# Patient Record
Sex: Male | Born: 1950 | Race: Black or African American | Hispanic: No | Marital: Married | Smoking: Never smoker
Health system: Southern US, Community
[De-identification: ages and names within clinical notes are randomized; demographics above are authoritative.]

## PROBLEM LIST (undated history)

## (undated) DIAGNOSIS — I10 Essential (primary) hypertension: Secondary | ICD-10-CM

## (undated) HISTORY — PX: KNEE SURGERY: SHX244

---

## 1974-03-15 DIAGNOSIS — G43909 Migraine, unspecified, not intractable, without status migrainosus: Secondary | ICD-10-CM | POA: Insufficient documentation

## 1993-03-15 DIAGNOSIS — J309 Allergic rhinitis, unspecified: Secondary | ICD-10-CM | POA: Insufficient documentation

## 2009-11-05 DIAGNOSIS — G4733 Obstructive sleep apnea (adult) (pediatric): Secondary | ICD-10-CM | POA: Insufficient documentation

## 2009-12-10 DIAGNOSIS — D126 Benign neoplasm of colon, unspecified: Secondary | ICD-10-CM | POA: Insufficient documentation

## 2012-04-24 DIAGNOSIS — R7303 Prediabetes: Secondary | ICD-10-CM | POA: Insufficient documentation

## 2016-12-16 ENCOUNTER — Emergency Department: Payer: BC Managed Care – PPO

## 2016-12-16 ENCOUNTER — Encounter: Payer: Self-pay | Admitting: Emergency Medicine

## 2016-12-16 ENCOUNTER — Emergency Department
Admission: EM | Admit: 2016-12-16 | Discharge: 2016-12-16 | Disposition: A | Discharge status: Home / Self Care | Payer: BC Managed Care – PPO | Attending: Emergency Medicine | Admitting: Emergency Medicine

## 2016-12-16 DIAGNOSIS — Y929 Unspecified place or not applicable: Secondary | ICD-10-CM | POA: Insufficient documentation

## 2016-12-16 DIAGNOSIS — Y998 Other external cause status: Secondary | ICD-10-CM | POA: Diagnosis not present

## 2016-12-16 DIAGNOSIS — S61213A Laceration without foreign body of left middle finger without damage to nail, initial encounter: Secondary | ICD-10-CM | POA: Diagnosis present

## 2016-12-16 DIAGNOSIS — S62663A Nondisplaced fracture of distal phalanx of left middle finger, initial encounter for closed fracture: Secondary | ICD-10-CM | POA: Insufficient documentation

## 2016-12-16 DIAGNOSIS — W312XXA Contact with powered woodworking and forming machines, initial encounter: Secondary | ICD-10-CM | POA: Diagnosis not present

## 2016-12-16 DIAGNOSIS — I10 Essential (primary) hypertension: Secondary | ICD-10-CM | POA: Diagnosis not present

## 2016-12-16 DIAGNOSIS — Z23 Encounter for immunization: Secondary | ICD-10-CM | POA: Diagnosis not present

## 2016-12-16 DIAGNOSIS — Y9389 Activity, other specified: Secondary | ICD-10-CM | POA: Diagnosis not present

## 2016-12-16 HISTORY — DX: Essential (primary) hypertension: I10

## 2016-12-16 MED ORDER — CEPHALEXIN 500 MG PO CAPS: 500.0000 mg | ORAL_CAPSULE | Freq: Four times a day (QID) | ORAL | 0 refills | Status: AC

## 2016-12-16 MED ORDER — TETANUS-DIPHTH-ACELL PERTUSSIS 5-2.5-18.5 LF-MCG/0.5 IM SUSP
0.5000 mL | Freq: Once | INTRAMUSCULAR | Status: AC
  Administered 2016-12-16: 0.5 mL via INTRAMUSCULAR
  Filled 2016-12-16: qty 0.5

## 2016-12-16 MED ORDER — LIDOCAINE HCL (PF) 1 % IJ SOLN
5.0000 mL | Freq: Once | INTRAMUSCULAR | Status: DC
  Filled 2016-12-16: qty 5

## 2016-12-16 NOTE — ED Triage Notes (Signed)
Patient presents to ED via POV due to a finger injury. Patient was using a saw when he accidentally cut his left middle finger. Slight bleeding noted.

## 2016-12-16 NOTE — ED Notes (Signed)
Pt was ambulatory to POV. NAD. VSS. No concerns or needs voiced at this time.

## 2016-12-16 NOTE — ED Provider Notes (Signed)
[facility] Emergency Department Provider Note   ____________________________________________   I have reviewed the triage vital signs and the nursing notes.   HISTORY  Chief Complaint Finger Injury    HPI Justin Gilbert is a 66 y.o. male presents to the emergency department with a laceration along the distal aspect of his left middle finger. Patient reports sawing with a power saw when he sustained a laceration to the pad of the DIP of the middle finger. Patient reports intact movement and sensation, as well as, managing hemorrhage control immediately following the injury. Patient is unsure if he "cut his finger to the bone or if debris entered the cut". Patient is unsure of his tetanus status. Patient denies fever, chills, headache, vision changes, chest pain, chest tightness, shortness of breath, abdominal pain, nausea and vomiting.  Past Medical History:  Diagnosis Date  . Hypertension     There are no active problems to display for this patient.   Past Surgical History:  Procedure Laterality Date  . KNEE SURGERY      Prior to Admission medications   Medication Sig Start Date End Date Taking? Authorizing Provider  cephALEXin (KEFLEX) 500 MG capsule Take 1 capsule (500 mg total) by mouth 4 (four) times daily. 12/16/16 12/21/16  Saphronia Ozdemir M, PA-C    Allergies Patient has no known allergies.  No family history on file.  Social History Social History  Substance Use Topics  . Smoking status: Never Smoker  . Smokeless tobacco: Never Used  . Alcohol use Yes     Comment: "on the weekends"    Review of Systems Constitutional: Negative for fever/chills Eyes: No visual changes. ENT:  Negative for sore throat and for difficulty swallowing Cardiovascular: Denies chest pain. Respiratory: Denies cough. Denies shortness of breath. Skin: Negative for rash. Laceration along the pad of the left middle digit. Neurological: Negative for headaches.   Negative focal weakness or numbness. Negative for loss of consciousness. Able to ambulate. ____________________________________________   PHYSICAL EXAM:  VITAL SIGNS: ED Triage Vitals [12/16/16 1330]  Enc Vitals Group     BP (!) 162/83     Pulse Rate (!) 102     Resp 17     Temp 98.5 F (36.9 C)     Temp Source Oral     SpO2 92 %     Weight 280 lb (127 kg)     Height 6\' 3"  (1.905 m)     Head Circumference      Peak Flow      Pain Score 2     Pain Loc      Pain Edu?      Excl. in [place]?     Constitutional: Alert and oriented. Well appearing and in no acute distress.  Head: Normocephalic and atraumatic. Cardiovascular: Normal rate, regular rhythm. Good peripheral circulation. Respiratory: Normal respiratory effort without tachypnea or retractions. Lungs CTAB.  Cardiovascular: Normal rate, regular rhythm. Normal distal pulses. Musculoskeletal: Nontender with normal range of motion in all extremities. Neurologic: Normal speech and language. Skin:  Skin is warm, dry and intact. No rash noted. Laceration lung the pad of the left middle digit approximately 3.0 cm without tendonous involvement. Very irrregular borders due to mechanism of injury being a power saw. Psychiatric: Mood and affect are normal. Speech and behavior are normal. Patient exhibits appropriate insight and judgement.  ____________________________________________   LABS (all labs ordered are listed, but only abnormal results are displayed)  Labs Reviewed - No data  to display ____________________________________________  EKG none ____________________________________________  RADIOLOGY DG left middle digit FINDINGS: Linear lucency within the proximal to midshaft of the third distal phalanx is suspicious for a nondisplaced fracture. No subluxation. Oval opacity projecting over the volar soft tissues of the distal digit, could represent small foreign body.  IMPRESSION: 1. Soft tissue laceration of the  distal third digit. 2. Nondisplaced fracture involving the proximal to midshaft of the third distal phalanx 3. Possible small oval foreign body projecting over the volar soft tissues in the region of the laceration  Imaging results reviewed, notable for non-displace fracture of the distal phalanx. Also notable for foreign body in the soft tissue of the distal phalanx consistent with debris found during irrigation. ____________________________________________   PROCEDURES  Procedure(s) performed:  LACERATION REPAIR Performed by: Jerolyn Shin Authorized by: Jerolyn Shin Consent: Verbal consent obtained. Risks and benefits: risks, benefits and alternatives were discussed Consent given by: patient Patient identity confirmed: provided demographic data Prepped and Draped in normal sterile fashion Wound explored  Laceration Location: pad of the left middle digit   Laceration Length: 2.5 cm  No Foreign Bodies seen or palpated  Anesthesia: transcortical block; local infiltration  Local anesthetic: lidocaine 1%  Anesthetic total: 5.0 ml  Irrigation method: syringe Amount of cleaning: standard  Skin closure: Monocryl 5-0; Ethilon 5-0  Number of sutures: (3) sutures, (12) sutures  Technique: simple interrupted  Patient tolerance: Patient tolerated the procedure well with no immediate complications.  Critical Care performed: no ____________________________________________   INITIAL IMPRESSION / ASSESSMENT AND PLAN / ED COURSE  Pertinent labs & imaging results that were available during my care of the patient were reviewed by me and considered in my medical decision making (see chart for details).  Patient sustained a laceration along the pad of the left middle digit.  Assessment confirmed movement and sensation of the digit before and after wound closure. Physical examination imaging notable for nondisplaced fracture of the distal phalanx of the left middle digit in  addition to possible foreign body in the soft tissue. Irrigation produced debris consistent with foreign body noted on imaging. Soft tissue was well irrigated prior to suturing. Laceration required suture closure as noted above. Patient tolerated procedure well. Due to the nature of debris and mechanism of injury patient will be prescribed cephalexin for about a coverage. She also received tetanus vaccine during course of care in emergency department. Pt instructed to keep wound clean and dry and will return to the emergency department or PCP for suture removal in 7 days. Patient also instructed to watch for signs of infection and return if changes are noted.  Patient informed of clinical course, understand medical decision-making process, and agree with plan.  ____________________________________________   FINAL CLINICAL IMPRESSION(S) / ED DIAGNOSES  Final diagnoses:  Laceration of left middle finger without foreign body, nail damage status unspecified, initial encounter  Closed nondisplaced fracture of distal phalanx of left middle finger, initial encounter       NEW MEDICATIONS STARTED DURING THIS VISIT:  New Prescriptions   CEPHALEXIN (KEFLEX) 500 MG CAPSULE    Take 1 capsule (500 mg total) by mouth 4 (four) times daily.     Note:  This document was prepared using Dragon voice recognition software and may include unintentional dictation errors.    Jerolyn Shin, PA-C 12/16/16 1649    Darel Hong, MD 12/17/16 [phone]

## 2016-12-16 NOTE — Discharge Instructions (Signed)
Keep wound area clean and dry and covered and environments that are dirty, dusty or wet.  Monitor the wound area for signs of developing infection. If you note any symptoms do not hesitate to follow up with your primary care or return to the emergency department.  Return to your primary care or emergency department in 7 days for suture removal.

## 2016-12-16 NOTE — ED Notes (Signed)
See triage note  Presents with laceration to left middle finger   Laceration from a saw  Bleeding controlled at present

## 2016-12-30 ENCOUNTER — Emergency Department
Admission: EM | Admit: 2016-12-30 | Discharge: 2016-12-30 | Disposition: A | Discharge status: Home / Self Care | Payer: Non-veteran care | Attending: Emergency Medicine | Admitting: Emergency Medicine

## 2016-12-30 ENCOUNTER — Encounter: Payer: Self-pay | Admitting: Emergency Medicine

## 2016-12-30 DIAGNOSIS — Z4802 Encounter for removal of sutures: Secondary | ICD-10-CM

## 2016-12-30 DIAGNOSIS — I10 Essential (primary) hypertension: Secondary | ICD-10-CM | POA: Insufficient documentation

## 2016-12-30 NOTE — ED Notes (Signed)
Pt has sutures in right index finger that he has in for three weeks. No s/s of infection

## 2016-12-30 NOTE — ED Notes (Signed)
Pt ambulatory without difficulty. VSS. NAD. Left middle finger was dressed with petroleum gauze and wrap secured with tape. Skin Pink, warm and dry.

## 2016-12-30 NOTE — ED Provider Notes (Signed)
[facility] Emergency Department Provider Note ____________________________________________  Time seen: 0858  I have reviewed the triage vital signs and the nursing notes.  HISTORY  Chief Complaint  Suture / Staple Removal  HPI Justin Gilbert is a 66 y.o. male presents to the ED for wound evaluation and suture removal. Patient was in the ED about 3 weeks prior, for a serious left middle finger DIP laceration. He also had an underlying tuft fracture with subcutaneous foreign body. The wound was appropriately flushed and the foreign body was resolved. His wound was closed with subcutaneous and dermal sutures. He presents now for wound check and suture removal. Denies any interim complaints.   Past Medical History:  Diagnosis Date  . Hypertension     There are no active problems to display for this patient.   Past Surgical History:  Procedure Laterality Date  . KNEE SURGERY      Prior to Admission medications   Not on File    Allergies Patient has no known allergies.  History reviewed. No pertinent family history.  Social History Social History  Substance Use Topics  . Smoking status: Never Smoker  . Smokeless tobacco: Never Used  . Alcohol use Yes     Comment: "on the weekends"    Review of Systems  Constitutional: Negative for fever. Cardiovascular: Negative for chest pain. Respiratory: Negative for shortness of breath. Musculoskeletal: Negative for back pain. Skin: Negative for rash. Left middle finger laceration s/p suture repair Neurological: Negative for headaches, focal weakness or numbness. ____________________________________________  PHYSICAL EXAM:  VITAL SIGNS: ED Triage Vitals  Enc Vitals Group     BP 12/30/16 0829 (!) 172/86     Pulse Rate 12/30/16 0829 84     Resp 12/30/16 0829 18     Temp 12/30/16 0827 97.7 F (36.5 C)     Temp Source 12/30/16 0827 Oral     SpO2 12/30/16 0829 96 %     Weight 12/30/16 0827 280 lb (127  kg)     Height 12/30/16 0827 6\' 3"  (1.905 m)     Head Circumference --      Peak Flow --      Pain Score 12/30/16 0945 0     Pain Loc --      Pain Edu? --      Excl. in [place]? --     Constitutional: Alert and oriented. Well appearing and in no distress. Head: Normocephalic and atraumatic.  Cardiovascular: Normal distal pulses. Respiratory: Normal respiratory effort.  Musculoskeletal: Nontender with normal range of motion in all extremities.  Neurologic:  Normal gait without ataxia. Normal speech and language. No gross focal neurologic deficits are appreciated. Skin:  Skin is warm, dry and intact. No rash noted. Left middle finger with nearly circumferential laceration to the distal phalanx. Several black nylon sutures are noted. Overlying wound adhesive is noted. The wound bed with minimal eschar and granulation tissue noted. No active bleeding. No erythema, edema, lymphangitis, or purulence noted.  ____________________________________________  PROCEDURES  SUTURE REMOVAL Performed by: Milinda Antis, RN  Consent: Verbal consent obtained. Patient identity confirmed: provided demographic data Time out: Immediately prior to procedure a "time out" was called to verify the correct patient, procedure, equipment, support staff and site/side marked as required.  Location details: left middle finger  Wound Appearance: clean  Sutures/Staples Removed: 12 nylon sutures   Facility: sutures placed in this facility Patient tolerance: Patient tolerated the procedure well with no immediate  complications. ____________________________________________  INITIAL IMPRESSION / ASSESSMENT AND PLAN / ED COURSE  Patient the ED evaluation for wound check and suture removal. The wound is healing well. He is dressed with petroleum-soaked gauze and dry dressing. Wound care instructions are provided. Follow-up with the [facility] or return to the ED as needed.   ____________________________________________  FINAL CLINICAL IMPRESSION(S) / ED DIAGNOSES  Final diagnoses:  Visit for suture removal      Carmie End, Dannielle Karvonen, PA-C 12/30/16 1118    Lisa Roca, MD 12/30/16 1244

## 2016-12-30 NOTE — ED Triage Notes (Signed)
Pt here to have stitches out of 3rd digit left hand. Placed 3 weeks ago. Pt reports had appt at [facility] for routine visit and was going to have taken out and then they cancelled appt so would like to get them out. Has had wrapped for 3 weeks.

## 2016-12-30 NOTE — Discharge Instructions (Signed)
Keep the wound clean, dry, and covered. Use only soap & water to cleanse. Follow-up with the [facility] as needed.

## 2019-01-12 IMAGING — DX DG FINGER MIDDLE 2+V*L*
3 series · 3 of 3 positions shown · non-contrast
Comparison: None.

CLINICAL DATA: Laceration

EXAM:
LEFT MIDDLE FINGER 2+V

[finger ap]
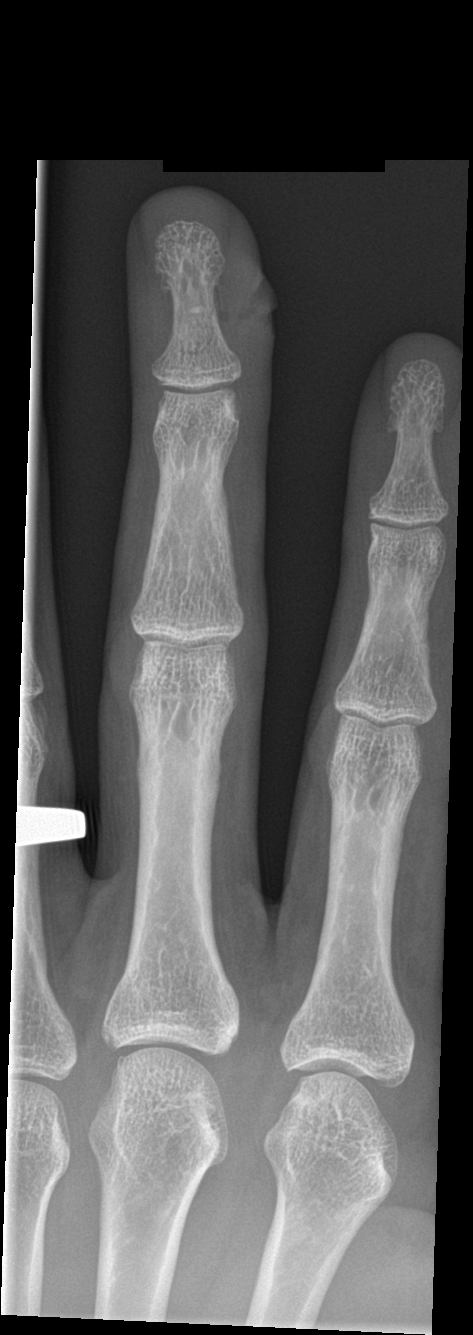

[finger obl]
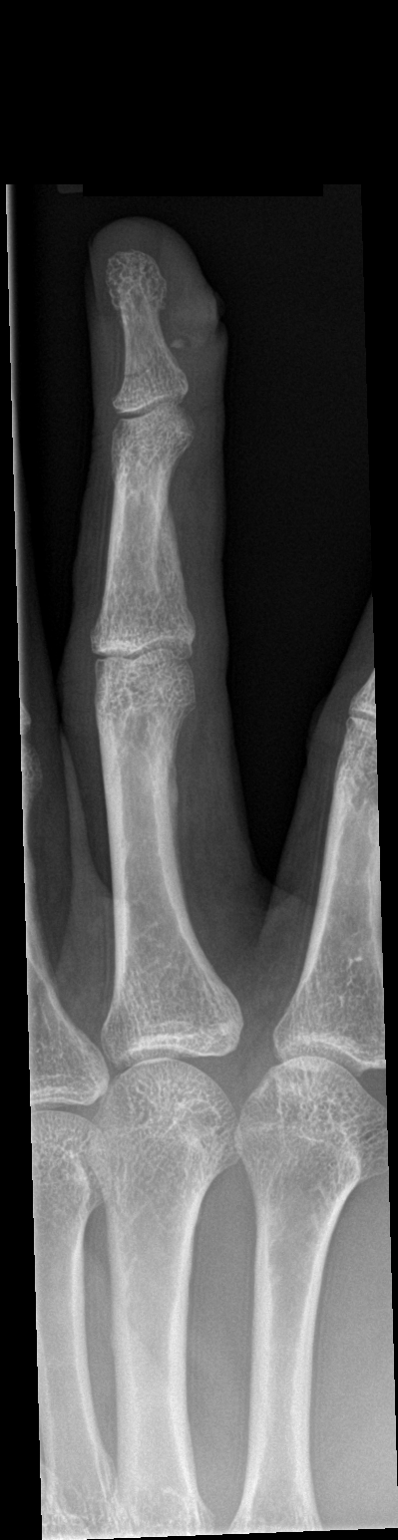

[finger lat]
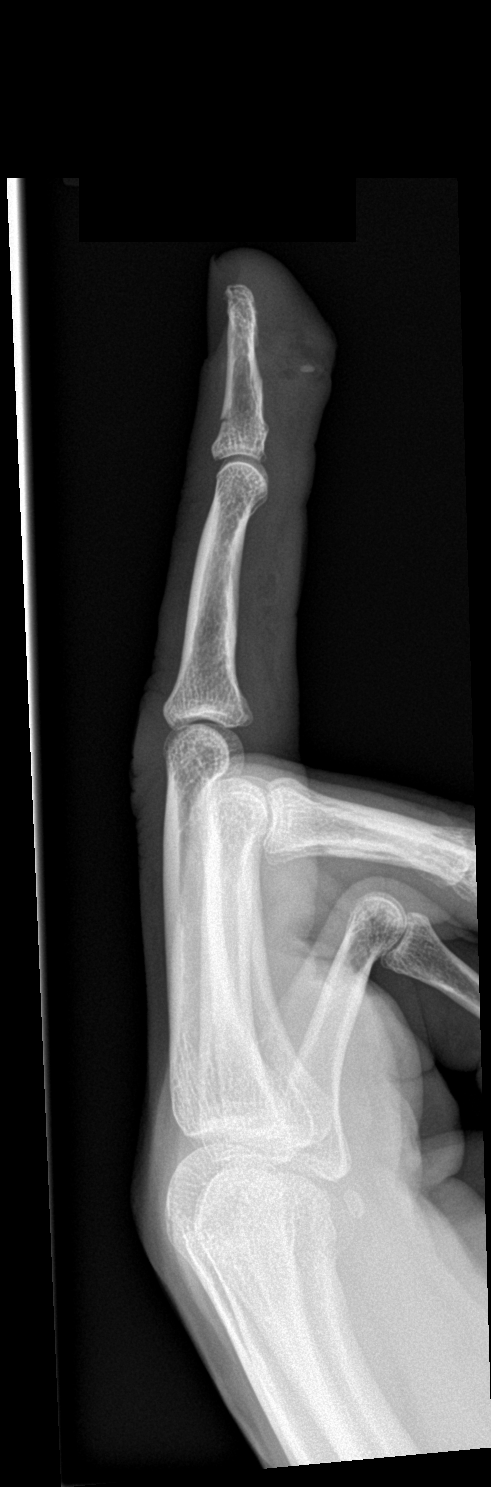

[3 of 3 positions shown; findings below may reference images not displayed]

FINDINGS: Linear lucency within the proximal to midshaft of the third distal
phalanx is suspicious for a nondisplaced fracture. No subluxation.
Oval opacity projecting over the volar soft tissues of the distal
digit, could represent small foreign body.
IMPRESSION: 1. Soft tissue laceration of the distal third digit.
2. Nondisplaced fracture involving the proximal to midshaft of the
third distal phalanx
3. Possible small oval foreign body projecting over the volar soft
tissues in the region of the laceration

## 2020-04-23 ENCOUNTER — Ambulatory Visit (INDEPENDENT_AMBULATORY_CARE_PROVIDER_SITE_OTHER): Payer: No Typology Code available for payment source

## 2020-04-23 ENCOUNTER — Encounter: Payer: Self-pay | Admitting: Podiatry

## 2020-04-23 ENCOUNTER — Ambulatory Visit: Payer: Non-veteran care | Admitting: Podiatry

## 2020-04-23 ENCOUNTER — Other Ambulatory Visit: Payer: Self-pay

## 2020-04-23 ENCOUNTER — Ambulatory Visit (INDEPENDENT_AMBULATORY_CARE_PROVIDER_SITE_OTHER): Payer: Non-veteran care | Admitting: Podiatry

## 2020-04-23 DIAGNOSIS — G4700 Insomnia, unspecified: Secondary | ICD-10-CM | POA: Insufficient documentation

## 2020-04-23 DIAGNOSIS — M21612 Bunion of left foot: Secondary | ICD-10-CM | POA: Diagnosis not present

## 2020-04-23 DIAGNOSIS — M2011 Hallux valgus (acquired), right foot: Secondary | ICD-10-CM | POA: Diagnosis not present

## 2020-04-23 DIAGNOSIS — M25519 Pain in unspecified shoulder: Secondary | ICD-10-CM | POA: Insufficient documentation

## 2020-04-23 DIAGNOSIS — S92425A Nondisplaced fracture of distal phalanx of left great toe, initial encounter for closed fracture: Secondary | ICD-10-CM | POA: Diagnosis not present

## 2020-04-23 DIAGNOSIS — M2012 Hallux valgus (acquired), left foot: Secondary | ICD-10-CM | POA: Diagnosis not present

## 2020-04-23 DIAGNOSIS — M21611 Bunion of right foot: Secondary | ICD-10-CM | POA: Diagnosis not present

## 2020-04-23 DIAGNOSIS — M2010 Hallux valgus (acquired), unspecified foot: Secondary | ICD-10-CM | POA: Diagnosis not present

## 2020-04-23 DIAGNOSIS — I2699 Other pulmonary embolism without acute cor pulmonale: Secondary | ICD-10-CM | POA: Insufficient documentation

## 2020-04-23 DIAGNOSIS — IMO0002 Reserved for concepts with insufficient information to code with codable children: Secondary | ICD-10-CM | POA: Insufficient documentation

## 2020-04-23 DIAGNOSIS — Z9989 Dependence on other enabling machines and devices: Secondary | ICD-10-CM | POA: Insufficient documentation

## 2020-04-23 DIAGNOSIS — R972 Elevated prostate specific antigen [PSA]: Secondary | ICD-10-CM | POA: Insufficient documentation

## 2020-04-23 DIAGNOSIS — G5600 Carpal tunnel syndrome, unspecified upper limb: Secondary | ICD-10-CM | POA: Insufficient documentation

## 2020-04-23 DIAGNOSIS — F528 Other sexual dysfunction not due to a substance or known physiological condition: Secondary | ICD-10-CM | POA: Insufficient documentation

## 2020-04-23 DIAGNOSIS — M064 Inflammatory polyarthropathy: Secondary | ICD-10-CM | POA: Insufficient documentation

## 2020-04-23 NOTE — Progress Notes (Signed)
  Subjective:  Patient ID: Justin Gilbert, male    DOB: 1950-10-06,  MRN: [id number]  Chief Complaint  Patient presents with  . Bunions    Patient presents today for bunions bilat hallux x years.  He denies any pain or discomfort.  He only wants them checked.    70 y.o. male presents with the above complaint. History confirmed with patient.  Bunions are not painful.  He does not feel like they limit his activity or lifestyle.  He was interested in what they are and what he can do about them.  Objective:  Physical Exam: warm, good capillary refill, no trophic changes or ulcerative lesions, normal DP and PT pulses and normal sensory exam.  Bilaterally he has mild hallux valgus and hallux interphalangeus deformity.  He has hallux limitus on the left side.  No pain with attempted range of motion of either joint.   Radiographs: X-ray of both feet: Laterally he has mild hallux valgus with hallux interphalangeus deformity comprising the majority of the deformity.  On the left side he has ankylosis of the metatarsophalangeal joint with maintained joint space, it appears to have previous fracture of the distal pulp of hallux Assessment:   1. Hallux valgus with bunions, left   2. Hallux valgus with bunions, right   3. Acquired hallux interphalangeus of left foot   4. Acquired hallux interphalangeus, right   5. Closed nondisplaced fracture of distal phalanx of left great toe, initial encounter      Plan:  Patient was evaluated and treated and all questions answered.  Discussed and reviewed the etiology and treatment options for bunions and hallux interphalangeus deformity in detail with patient.  Also discussed with him that he appears to have had a previous fracture of the distal phalanx.  He does recall back and late December and early January he stubbed the toe very hard.  Not currently painful for him.  Do not think we will need further treatment for this.  He does have hallux limitus of the  left side again this is nonpainful similar to the hallux valgus.  I discussed with him nonsurgical treatment including wider and accommodative shoes with square toe box.  If they become painful or limiting his lifestyle he will consider surgery and return as needed for this.  Return if symptoms worsen or fail to improve.

## 2020-04-23 NOTE — Patient Instructions (Signed)
Bunion A bunion (hallux valgus) is a bump that forms slowly on the inner side of the big toe joint. It occurs when the big toe turns toward the second toe. Bunions may be small at first, but they often get larger over time. They can make walking painful. What are the causes? This condition may be caused by:  Wearing narrow or pointed shoes that force the big toe to press against the other toes.  Abnormal foot development that causes the foot to roll inward.  Changes in the foot that are caused by certain diseases, such as rheumatoid arthritis or polio.  A foot injury. What increases the risk? The following factors may make you more likely to develop this condition:  Wearing shoes that squeeze the toes together.  Having certain diseases, such as: ? Rheumatoid arthritis. ? Polio. ? Cerebral palsy.  Having family members who have bunions.  Being born with abnormally shaped feet (a foot deformity), such as flat feet or low arches.  Doing activities that put a lot of pressure on the feet, such as ballet dancing. What are the signs or symptoms? The main symptom of this condition is a bump on your big toe that you can notice. Other symptoms may include:  Pain.  Redness and inflammation around your big toe.  Thick or hardened skin on your big toe or between your toes.  Stiffness or loss of motion in your big toe.  Trouble with walking.   How is this diagnosed? This condition may be diagnosed based on your symptoms, medical history, and activities. You may also have tests and imaging, such as:  X-rays. These allow your health care provider to check the position of the bones in your foot and look for damage to your joint. They also help your health care provider determine the severity of your bunion and the best way to treat it.  Joint aspiration. In this test, a sample of fluid is removed from the toe joint. This test may be done if you are in a lot of pain. It helps rule out  diseases that cause painful swelling of the joints, such as arthritis or gout. How is this treated? Treatment depends on the severity of your symptoms. The goal of treatment is to relieve symptoms and prevent your bunion from getting worse. Your health care provider may recommend:  Wearing shoes that have a wide toe box, or using bunion pads to cushion the affected area.  Taping your toes together to keep them in a normal position.  Placing a device inside your shoe (orthotic device) to help reduce pressure on your toe joint.  Taking medicine to ease pain and inflammation.  Putting ice or heat on the affected area.  Doing stretching exercises.  Surgery, for severe cases. Follow these instructions at home: Managing pain, stiffness, and swelling  If directed, put ice on the painful area. To do this: ? Put ice in a plastic bag. ? Place a towel between your skin and the bag. ? Leave the ice on for 20 minutes, 2-3 times a day. ? Remove the ice if your skin turns bright red. This is very important. If you cannot feel pain, heat, or cold, you have a greater risk of damage to the area.  If directed, apply heat to the affected area before you exercise. Use the heat source that your health care provider recommends, such as a moist heat pack or a heating pad. ? Place a towel between your skin and the   heat source. ? Leave the heat on for 20-30 minutes. ? Remove the heat if your skin turns bright red. This is especially important if you are unable to feel pain, heat, or cold. You have a greater risk of getting burned.      General instructions  Do exercises as told by your health care provider.  Support your toe joint with proper footwear, shoe padding, or taping as told by your health care provider.  Take over-the-counter and prescription medicines only as told by your health care provider.  Do not use any products that contain nicotine or tobacco, such as cigarettes, e-cigarettes, and  chewing tobacco. If you need help quitting, ask your health care provider.  Keep all follow-up visits. This is important. Contact a health care provider if:  Your symptoms get worse.  Your symptoms do not improve in 2 weeks. Get help right away if:  You have severe pain and trouble with walking. Summary  A bunion is a bump on the inner side of the big toe joint that forms when the big toe turns toward the second toe.  Bunions can make walking painful.  Treatment depends on the severity of your symptoms.  Support your toe joint with proper footwear, shoe padding, or taping as told by your health care provider. This information is not intended to replace advice given to you by your health care provider. Make sure you discuss any questions you have with your health care provider. Document Revised: 07/06/2019 Document Reviewed: 07/06/2019 Elsevier Patient Education  2021 [employer]  Bunion surgery is done to remove a bunion, which is a bump that forms slowly on the inner side of the big toe joint. A bunion can develop over time when pressure turns the big toe toward the second toe. Bunions may be caused by wearing shoes that are narrow or pointed. They can also be caused by certain conditions, such as rheumatoid arthritis and flat feet. You may need bunion surgery if your bunion is very large or painful or it affects your ability to walk. Tell a health care provider about:  Any allergies you have.  All medicines you are taking, including vitamins, herbs, eye drops, creams, and over-the-counter medicines.  Any problems you or family members have had with anesthetic medicines.  Any blood disorders you have.  Any surgeries you have had.  Any medical conditions you have.  Whether you are pregnant or may be pregnant. What are the risks? Generally, this is a safe procedure. However, problems may occur, including:  Infection.  Bleeding or blood  clots.  Allergic reactions to medicines.  Nerve damage.  Pain, numbness, stiffness, or arthritis in the toe.  Return of the bunion.  Failure of the bone to heal completely after surgery, or failure of the surgery to relieve symptoms. What happens before the procedure? Staying hydrated Follow instructions from your health care provider about hydration, which may include:  Up to 2 hours before the procedure - you may continue to drink clear liquids, such as water, clear fruit juice, black coffee, and plain tea.   Eating and drinking restrictions  Follow instructions from your health care provider about eating and drinking, which may include: ? 8 hours before the procedure - stop eating heavy meals or foods, such as meat, fried foods, or fatty foods. ? 6 hours before the procedure - stop eating light meals or foods, such as toast or cereal. ? 6 hours before the procedure - stop drinking  milk or drinks that contain milk. ? 2 hours before the procedure - stop drinking clear liquids.  Do not drink alcohol for the length of time you are told by your health care provider. Medicines Ask your health care provider about:  Changing or stopping your regular medicines. This is especially important if you are taking diabetes medicines or blood thinners.  Taking medicines such as aspirin and ibuprofen. These medicines can thin your blood. Do not take these medicines unless your health care provider tells you to take them.  Taking over-the-counter medicines, vitamins, herbs, and supplements. General instructions  Do not use any products that contain nicotine or tobacco for at least 4 weeks before the procedure. These products include cigarettes, e-cigarettes, and chewing tobacco. If you need help quitting, ask your health care provider.  Plan to have a responsible adult take you home from the hospital or clinic.  If you will be going home right after the procedure, plan to have a responsible  adult care for you for the time you are told. This is important.  Ask your health care provider: ? How your surgery site will be marked. ? What steps will be taken to help prevent infection. These steps may include:  Washing skin with a germ-killing soap.  Taking antibiotic medicine. What happens during the procedure?  An IV will be inserted into one of your veins.  You will be given one or more of the following: ? A medicine to help you relax (sedative). ? A medicine to numb the area (local anesthetic). ? A medicine that is injected into an area of your body to numb everything below the injection site (regional anesthetic). ? A medicine to make you fall asleep (general anesthetic).  An incision will be made over the bump at the big toe joint. Depending on the type of procedure that is needed for your bunion, your surgeon may do one or more of the following: ? Exostectomy. This is a procedure to remove the bunion. ? Osteotomy. This is a procedure to cut and realign the damaged joint in your big toe. ? Arthrodesis. For the procedure, hardware, such as screws, is used to keep your foot in the correct position. ? Tightening or loosening tissues around the big toe to reposition the toe.  The incision will be closed with stitches (sutures) and covered with adhesive strips or another type of bandage (dressing).  A supportive device, such as a boot, may be placed on your foot. A boot is a cast that you can walk on. The procedure may vary among health care providers and hospitals. What happens after the procedure?  Your blood pressure, heart rate, breathing rate, and blood oxygen level will be monitored until you leave the hospital or clinic.  If you were given a sedative during the procedure, it can affect you for several hours. Do not drive or operate machinery until your health care provider says that it is safe.  Ask your health care provider when it is safe to drive if you have a boot  or other supportive device on your foot.  You may be given instructions about weight-bearing restrictions. These instructions tell you how much body weight you can or cannot support on your foot. You may be given crutches, a cane, or a walker to help you move around so that you do not put (bear) weight on your foot. Summary  Bunion surgery is done to remove a bunion, which is a bump that forms slowly on  the inner side of the big toe joint.  You may need bunion surgery if your bunion is very large or painful or it affects your ability to walk.  Before the procedure, follow instructions from your health care provider about eating and drinking, and ask about changing or stopping your regular medicines. This information is not intended to replace advice given to you by your health care provider. Make sure you discuss any questions you have with your health care provider. Document Revised: 07/06/2019 Document Reviewed: 07/06/2019 Elsevier Patient Education  2021 [employer].
